# Patient Record
Sex: Female | Born: 1960 | Race: Black or African American | Hispanic: No | Marital: Married
Health system: Southern US, Community
[De-identification: ages and names within clinical notes are randomized; demographics above are authoritative.]

---

## 2002-02-21 ENCOUNTER — Other Ambulatory Visit: Admission: RE | Admit: 2002-02-21 | Discharge: 2002-02-21 | Payer: Self-pay | Admitting: Family Medicine

## 2005-07-09 ENCOUNTER — Other Ambulatory Visit: Admission: RE | Admit: 2005-07-09 | Discharge: 2005-07-09 | Payer: Self-pay | Admitting: Family Medicine

## 2006-03-16 ENCOUNTER — Ambulatory Visit (HOSPITAL_COMMUNITY): Admission: RE | Admit: 2006-03-16 | Discharge: 2006-03-16 | Payer: Self-pay | Admitting: Family Medicine

## 2007-09-03 ENCOUNTER — Ambulatory Visit (HOSPITAL_COMMUNITY): Admission: RE | Admit: 2007-09-03 | Discharge: 2007-09-03 | Payer: Self-pay | Admitting: Emergency Medicine

## 2009-05-07 IMAGING — CT CT ABDOMEN W/ CM
2 of 5 series · 16 of 46 positions shown, 18 images · IV contrast (omnipaque)
Comparison: Ultrasound, 09/03/07.

CLINICAL DATA: Cramping and upper abdominal pain.  
ABDOMEN CT WITH CONTRAST ? 09/03/07:
TECHNIQUE: Multidetector CT imaging of the abdomen was performed following the standard protocol during bolus administration of intravenous contrast. 
Contrast: 125 ml Omnipaque 350 IV and oral contrast.
TECHNIQUE: Multidetector CT imaging of the pelvis was performed following the standard protocol during bolus administration of intravenous contrast.

[Series 2: abd_pel 5.0 b40f st · axial · 0.63mm/px · z∈[-372,-2]mm · 13 of 84 slices shown, 15 images]
[im 5/84  soft-tissue]
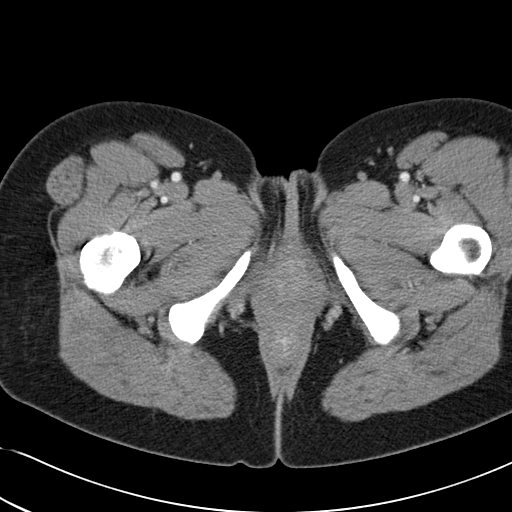
[im 5/84  bone]
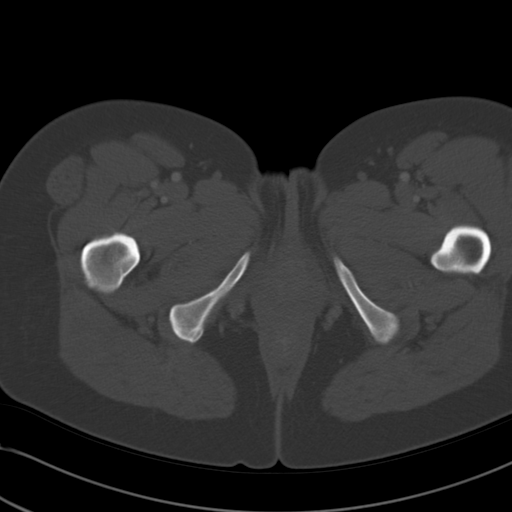
[im 14/84  soft-tissue]
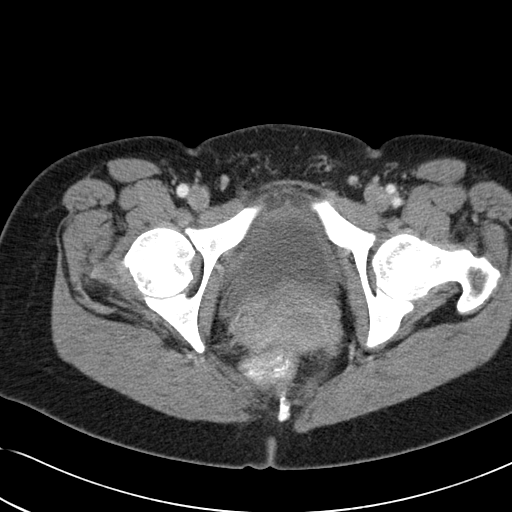
[im 18/84  soft-tissue]
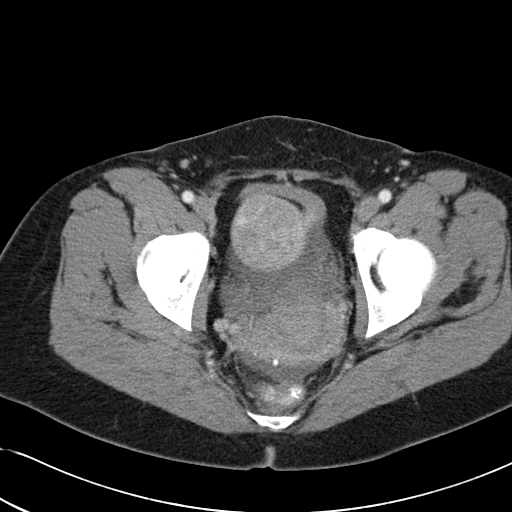
[im 22/84  soft-tissue]
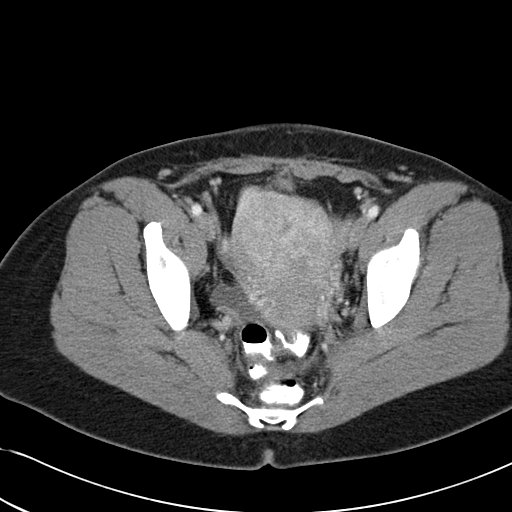
[im 31/84  soft-tissue]
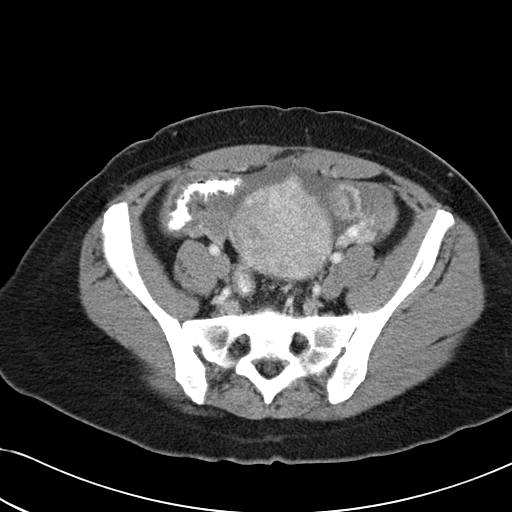
[im 35/84  soft-tissue]
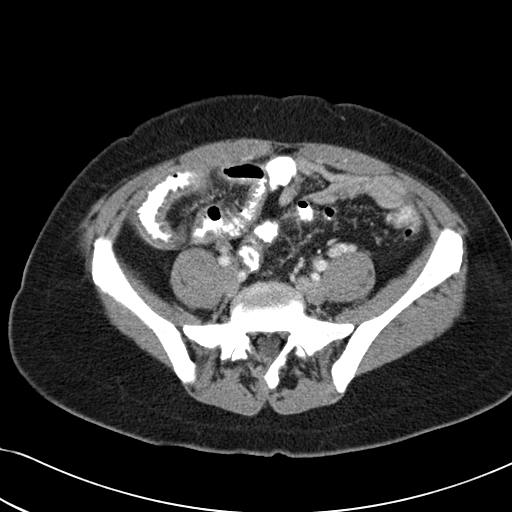
[im 44/84  soft-tissue]
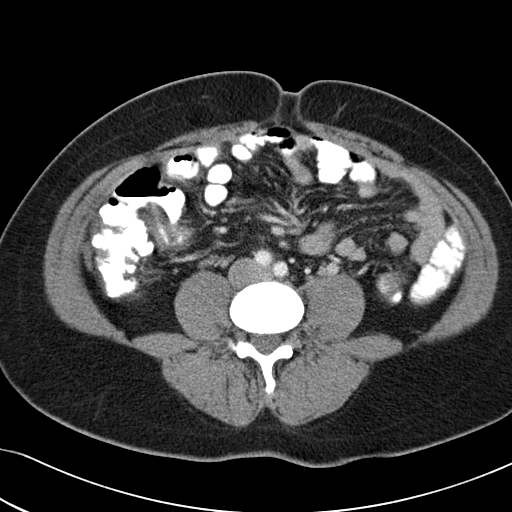
[im 49/84  soft-tissue]
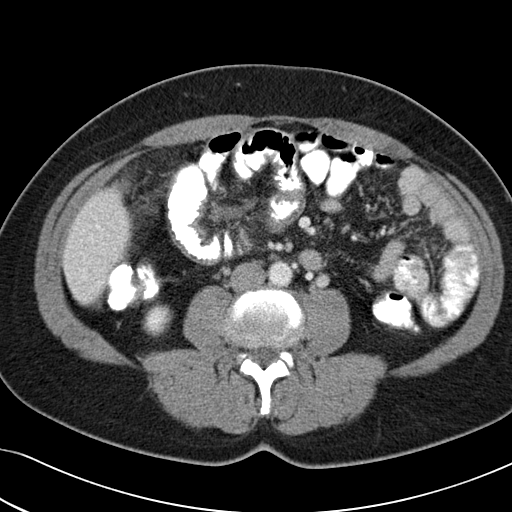
[im 53/84  soft-tissue]
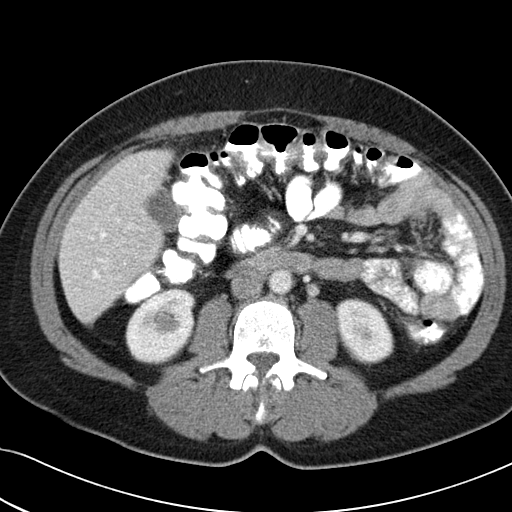
[im 53/84  bone]
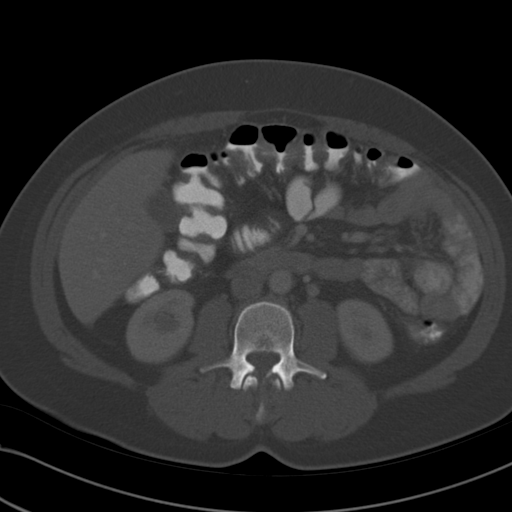
[im 62/84  soft-tissue]
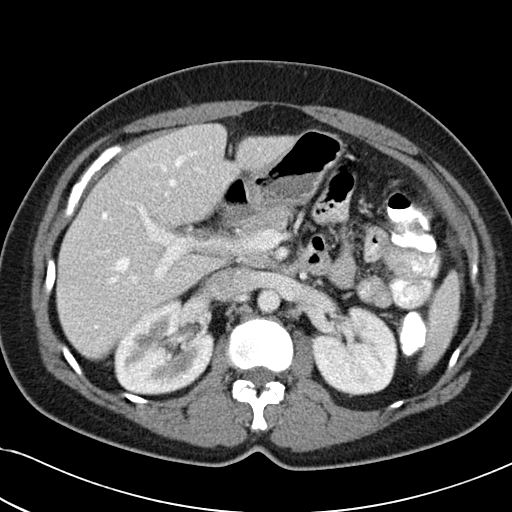
[im 66/84  soft-tissue]
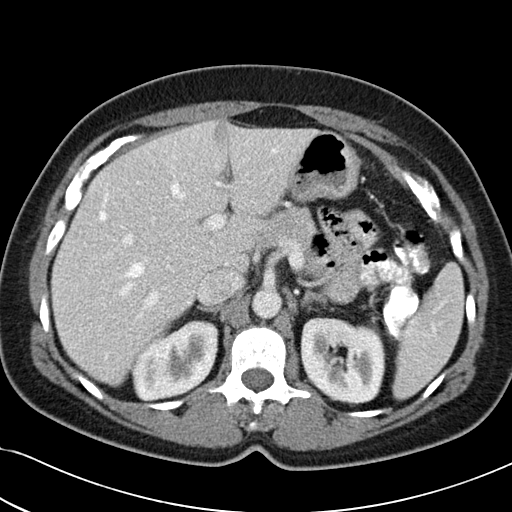
[im 70/84  soft-tissue]
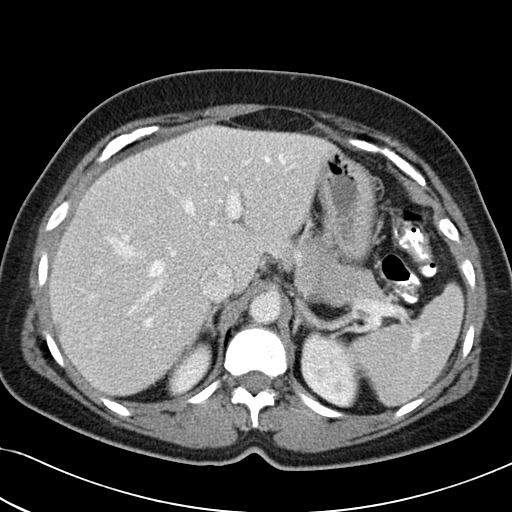
[im 79/84  soft-tissue]
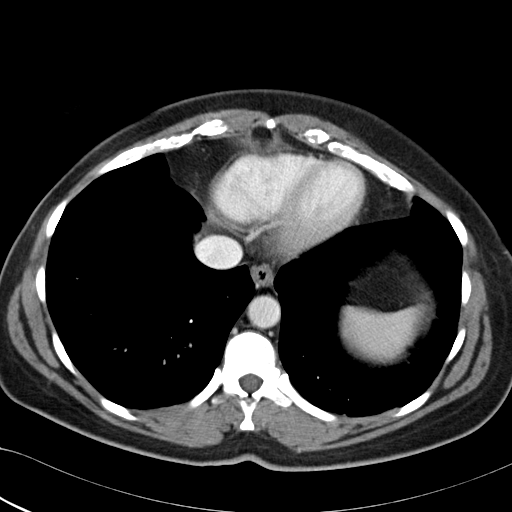

[Series 602: coronal · coronal · 0.85mm/px · 3 of 66 slices shown]
[im 22/66  soft-tissue]
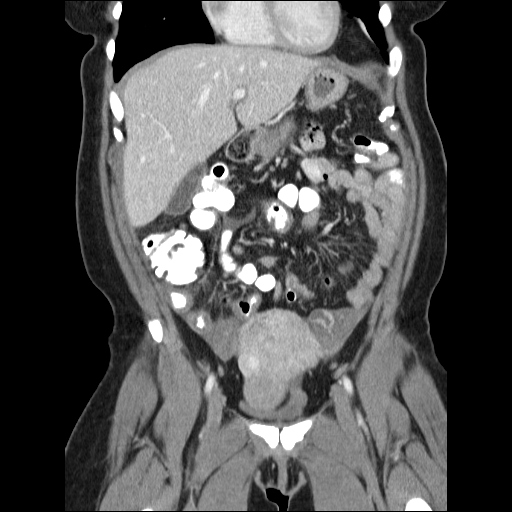
[im 29/66  soft-tissue]
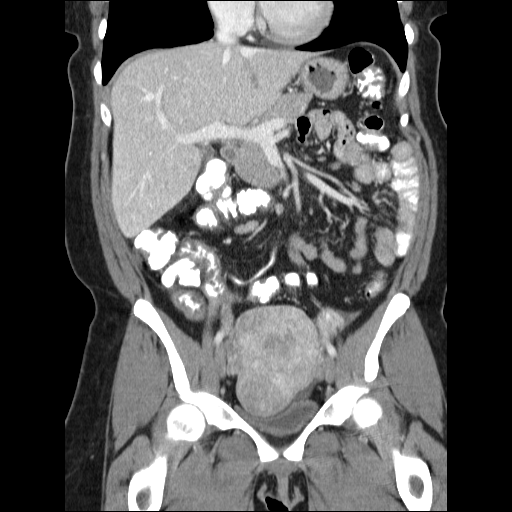
[im 37/66  soft-tissue]
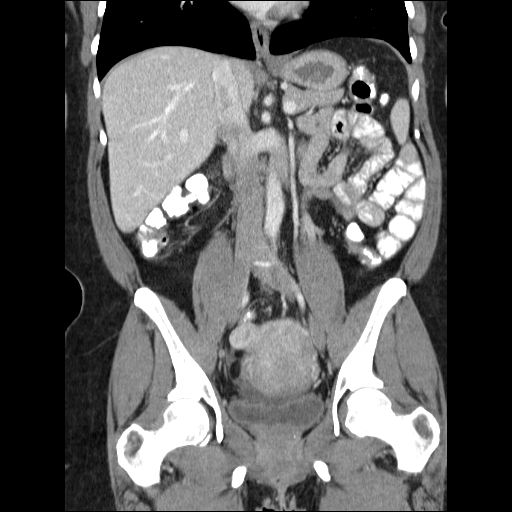

[16 of 46 positions shown; findings below may reference images not displayed]

FINDINGS: Dependent atelectasis.  Small cyst in the anterior right lower lobe.  
Mild fatty infiltration adjacent to the falciform ligament in the liver.  Normal renal enhancement and excretion of contrast.  The adrenal glands are normal bilaterally.  The spleen is unremarkable.  The stomach and duodenum are within normal limits.  The gallbladder appears normal or CT.  There is mild thickening of small bowel, likely representing jejunum.  This is present in the upper abdomen around image #35.  No evidence of obstruction.  There is small bowel inflammation of the ileum extending to the ileocecal valve.  Mild inflammatory stranding in the mesentery.  No colonic inflammatory process is identified.  A 1.5 cm mesenteric lymph node is identified on image #38 just anterior to the IVC.  A small amount of fluid is present in the leaves of the small bowel mesentery.  There is a bulbous appearance of the uncinate process of the pancreas however I think this probably represents a normal variation and no focal mass is identified.  Attention on followup exam is recommended.
IMPRESSION: 1.  Diffusely thickened ileum extending to the ileocecal valve, with small bowel mesenteric lymphadenopathy and fluid.  Although the appearance is nonspecific, it is suggestive of inflammatory bowel disease, specifically Crohn's disease.  Infectious and a variety of other causes can produce a similar appearance.  Ischemia is felt unlikely.  
2.  Bulbous appearance uncinate process of pancreas.  Although no mass is identified a followup CT in 1 to 2 months is recommended to reassess.  
PELVIS CT WITH CONTRAST ? 09/03/07:
FINDINGS: Fibroid uterus.  There is a moderate amount of free fluid in the anatomic pelvis, likely secondary to an abdominal inflammatory process.  The colon appears within normal limits and is decompressed.  The appendix is identified and normal.  The urinary bladder is unremarkable.  Sclerosis of both sacroiliac joints is present, and although the appearance can be seen in association with arthropathic spondylarthritis, the appearance is most suggestive of osteitis condensans ilii.
IMPRESSION: 1.  Free fluid in the anatomic pelvis secondary to small bowel inflammation.
2.  Fibroid uterus.
3.  Sclerosis of the iliac bone most consistent with osteitis condensans ilii, benign etiology.

## 2010-07-05 ENCOUNTER — Encounter: Payer: Self-pay | Admitting: Family Medicine

## 2011-06-30 ENCOUNTER — Other Ambulatory Visit: Payer: Self-pay | Admitting: Physician Assistant

## 2011-06-30 ENCOUNTER — Other Ambulatory Visit (HOSPITAL_COMMUNITY)
Admission: RE | Admit: 2011-06-30 | Discharge: 2011-06-30 | Disposition: A | Discharge status: Home / Self Care | Payer: 59 | Source: Ambulatory Visit | Attending: Family Medicine | Admitting: Family Medicine

## 2011-06-30 DIAGNOSIS — Z0000 Encounter for general adult medical examination without abnormal findings: Secondary | ICD-10-CM | POA: Insufficient documentation

## 2014-05-29 ENCOUNTER — Other Ambulatory Visit (HOSPITAL_COMMUNITY)
Admission: RE | Admit: 2014-05-29 | Discharge: 2014-05-29 | Disposition: A | Discharge status: Home / Self Care | Payer: 59 | Source: Ambulatory Visit | Attending: Family Medicine | Admitting: Family Medicine

## 2014-05-29 ENCOUNTER — Other Ambulatory Visit: Payer: Self-pay | Admitting: Physician Assistant

## 2014-05-29 DIAGNOSIS — Z01411 Encounter for gynecological examination (general) (routine) with abnormal findings: Secondary | ICD-10-CM | POA: Diagnosis present

## 2014-05-29 DIAGNOSIS — Z1151 Encounter for screening for human papillomavirus (HPV): Secondary | ICD-10-CM | POA: Diagnosis present

## 2014-05-30 ENCOUNTER — Other Ambulatory Visit (HOSPITAL_COMMUNITY): Payer: Self-pay | Admitting: Family Medicine

## 2014-05-30 ENCOUNTER — Other Ambulatory Visit: Payer: Self-pay | Admitting: Family Medicine

## 2014-05-30 DIAGNOSIS — IMO0002 Reserved for concepts with insufficient information to code with codable children: Secondary | ICD-10-CM

## 2014-05-30 DIAGNOSIS — Z1231 Encounter for screening mammogram for malignant neoplasm of breast: Secondary | ICD-10-CM

## 2014-05-30 DIAGNOSIS — R229 Localized swelling, mass and lump, unspecified: Principal | ICD-10-CM

## 2014-05-30 LAB — CYTOLOGY - PAP

## 2014-06-10 ENCOUNTER — Other Ambulatory Visit: Payer: 59

## 2014-06-11 ENCOUNTER — Ambulatory Visit
Admission: RE | Admit: 2014-06-11 | Discharge: 2014-06-11 | Disposition: A | Discharge status: Home / Self Care | Payer: 59 | Source: Ambulatory Visit | Attending: Family Medicine | Admitting: Family Medicine

## 2014-06-11 ENCOUNTER — Encounter (INDEPENDENT_AMBULATORY_CARE_PROVIDER_SITE_OTHER): Payer: Self-pay

## 2014-06-11 DIAGNOSIS — IMO0002 Reserved for concepts with insufficient information to code with codable children: Secondary | ICD-10-CM

## 2014-06-11 DIAGNOSIS — R229 Localized swelling, mass and lump, unspecified: Principal | ICD-10-CM

## 2014-06-17 ENCOUNTER — Ambulatory Visit (HOSPITAL_COMMUNITY): Payer: 59

## 2014-07-08 ENCOUNTER — Ambulatory Visit (HOSPITAL_COMMUNITY): Payer: 59 | Attending: Family Medicine

## 2016-02-13 IMAGING — US US ABDOMEN COMPLETE
1 series · 14 of 25 positions shown · non-contrast
Comparison: CT 09/03/2007.  Ultrasound 09/03/2007.

CLINICAL DATA: Periumbilical mass.

EXAM:
ULTRASOUND ABDOMEN COMPLETE

[Series 1: us abdomen complete · 0.30mm/px · 14 of 81 slices shown]
[im 1/81]
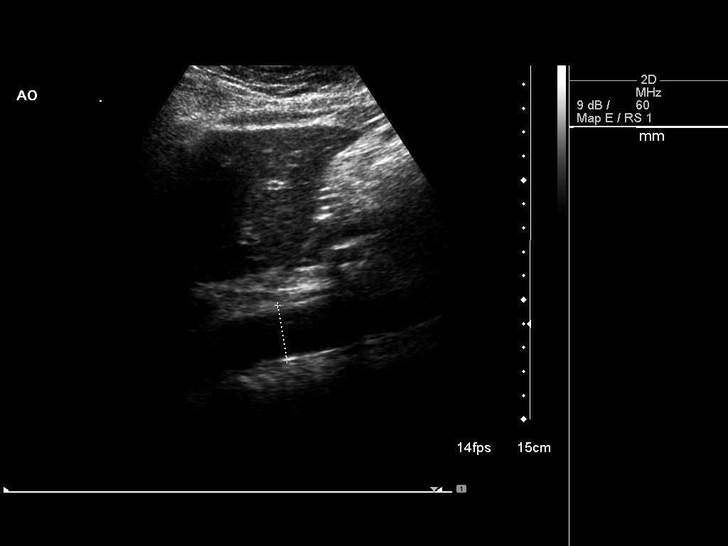
[im 7/81]
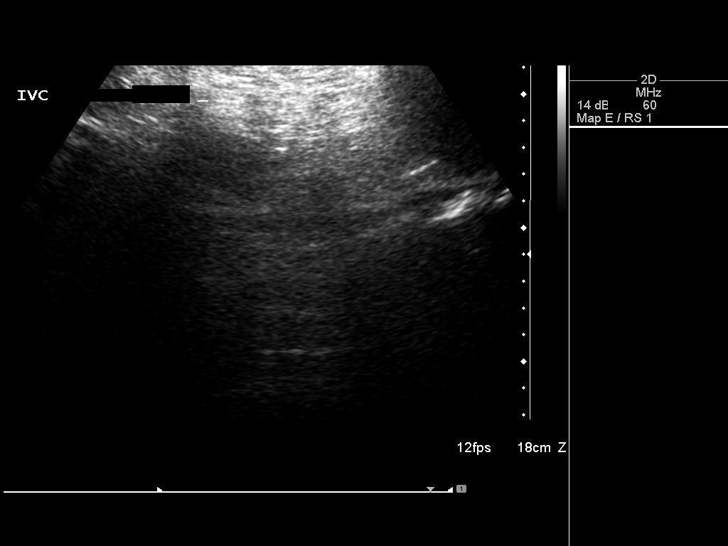
[im 14/81]
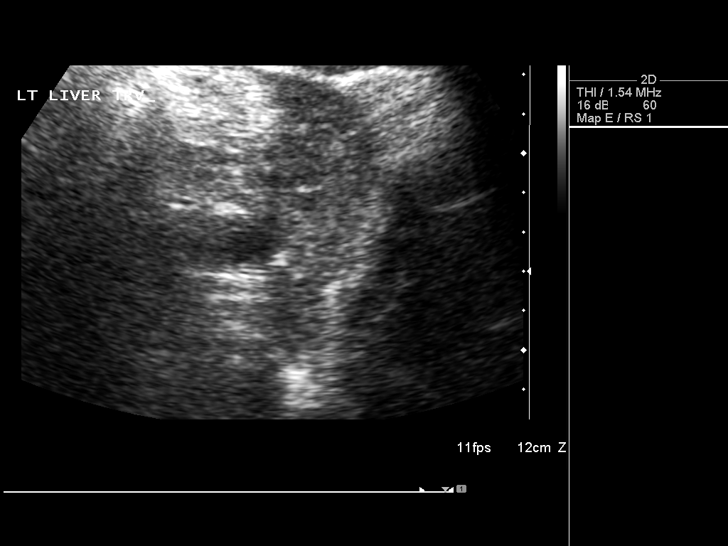
[im 21/81]
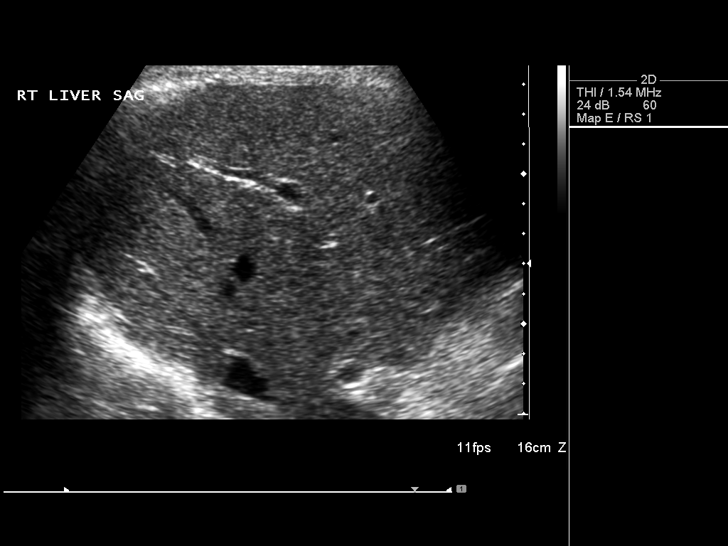
[im 27/81]
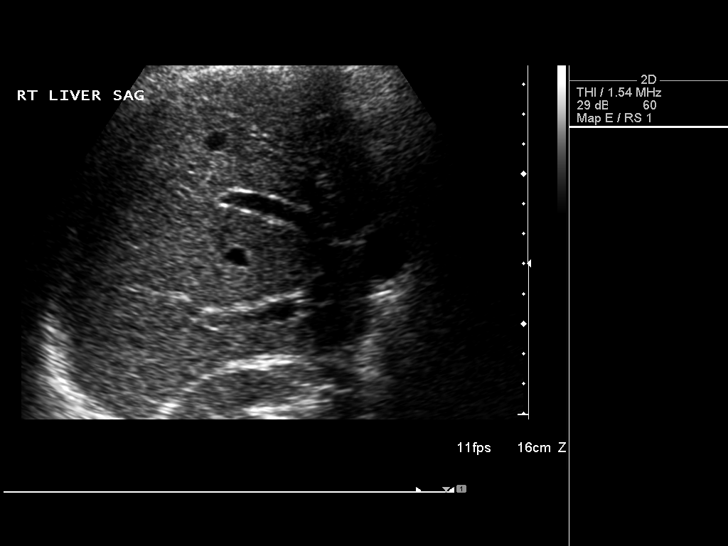
[im 31/81]
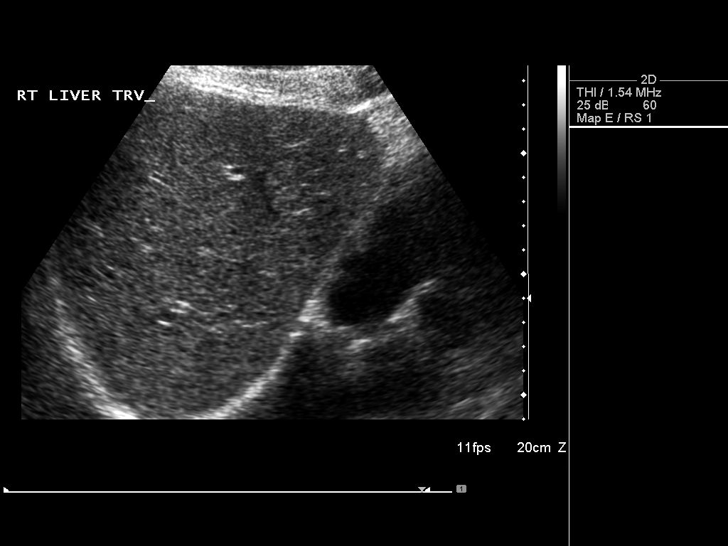
[im 37/81]
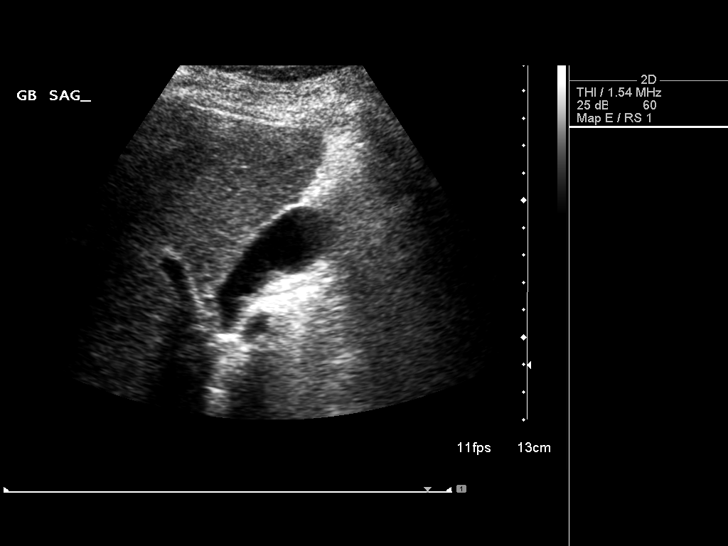
[im 44/81]
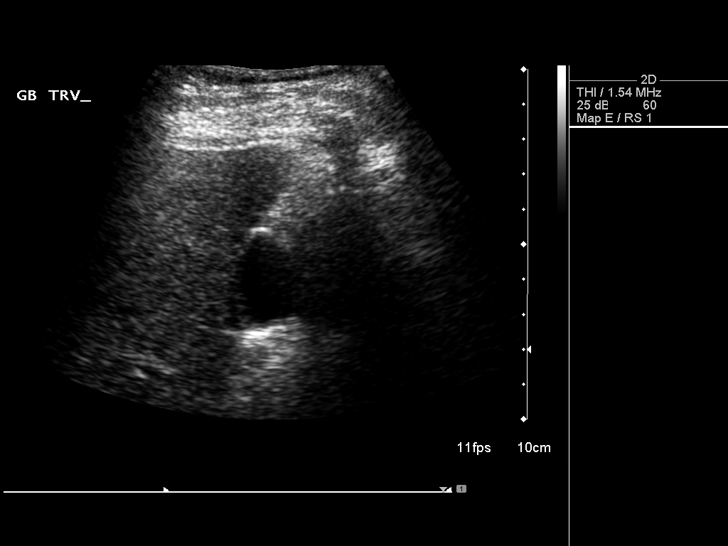
[im 51/81]
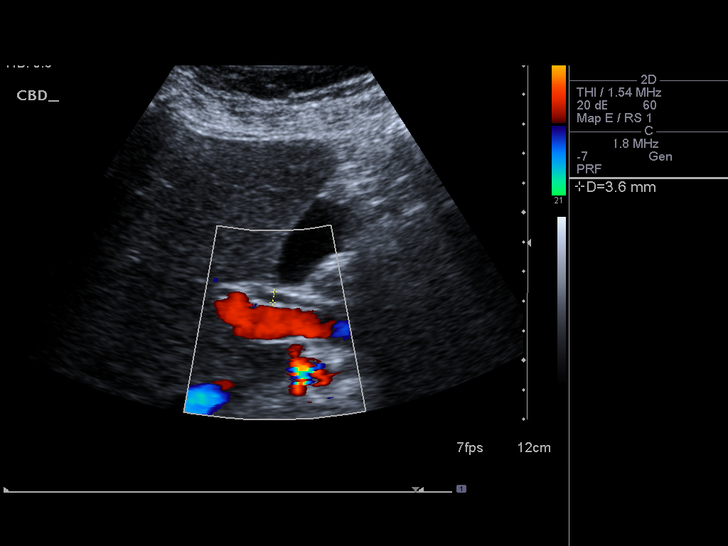
[im 54/81]
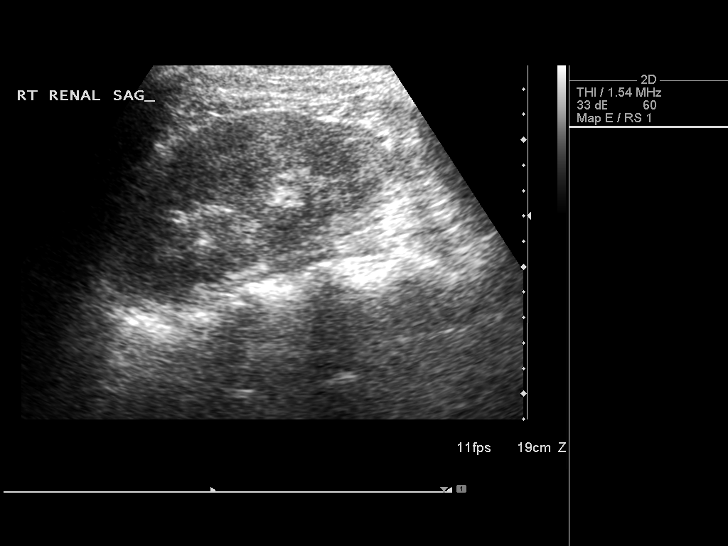
[im 61/81]
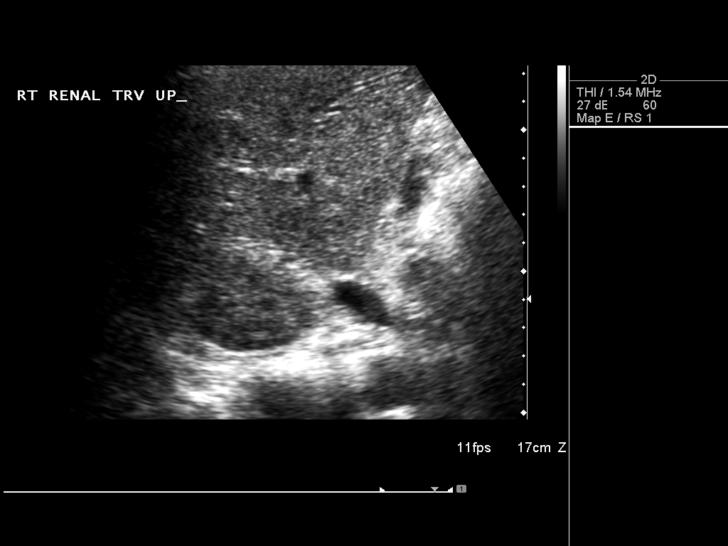
[im 67/81]
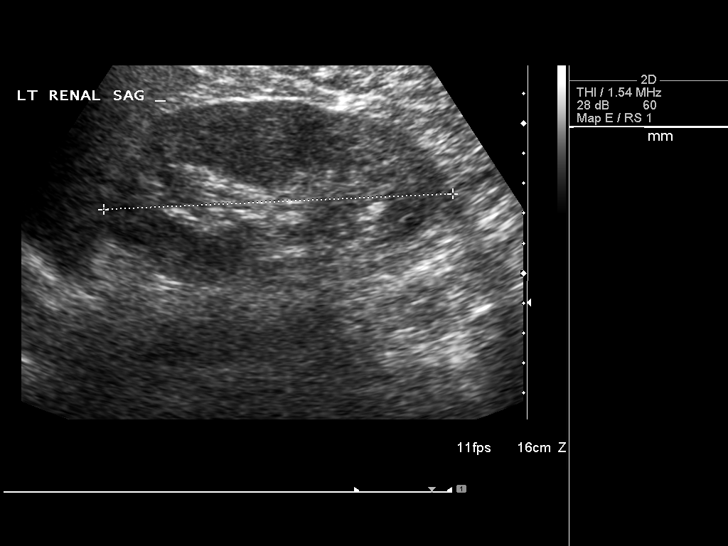
[im 74/81]
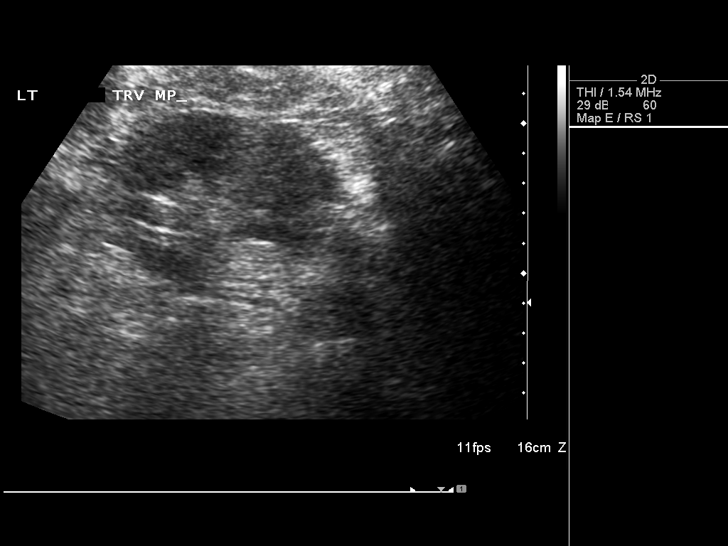
[im 81/81]
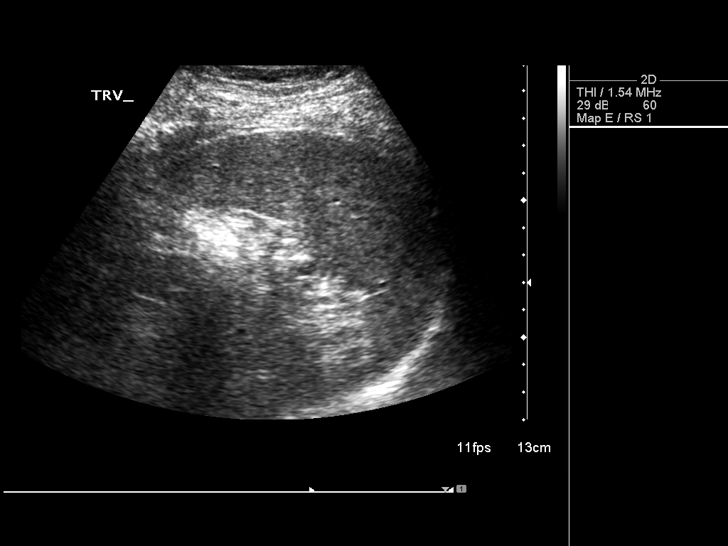

[14 of 25 positions shown; findings below may reference images not displayed]

FINDINGS: Gallbladder: No gallstones or wall thickening visualized. No
sonographic Murphy sign noted.

Common bile duct: Diameter: 3.6 mm

Liver: No focal lesion identified. Within normal limits in
parenchymal echogenicity.

IVC: No abnormality visualized.

Pancreas: Not visualized secondary to bowel gas.

Spleen: Size and appearance within normal limits.

Right Kidney: Length: 12.4 cm. Echogenicity within normal limits. No
mass or hydronephrosis visualized.

Left Kidney: Length: 11.7 cm. Echogenicity within normal limits. No
mass or hydronephrosis visualized.

Abdominal aorta: No aneurysm visualized.

Other findings: Incidental uterine enlargement noted.
IMPRESSION: 1. No acute abnormality.
2. Incidental uterine enlargement noted.

## 2016-11-04 ENCOUNTER — Ambulatory Visit: Payer: Self-pay | Admitting: Family Medicine

## 2016-11-04 DIAGNOSIS — G44209 Tension-type headache, unspecified, not intractable: Secondary | ICD-10-CM | POA: Diagnosis not present

## 2017-07-04 DIAGNOSIS — R002 Palpitations: Secondary | ICD-10-CM | POA: Diagnosis not present

## 2017-07-04 DIAGNOSIS — R8290 Unspecified abnormal findings in urine: Secondary | ICD-10-CM | POA: Diagnosis not present

## 2017-07-04 DIAGNOSIS — N951 Menopausal and female climacteric states: Secondary | ICD-10-CM | POA: Diagnosis not present

## 2017-12-29 DIAGNOSIS — R202 Paresthesia of skin: Secondary | ICD-10-CM | POA: Diagnosis not present

## 2018-04-04 DIAGNOSIS — E559 Vitamin D deficiency, unspecified: Secondary | ICD-10-CM | POA: Diagnosis not present

## 2019-04-12 ENCOUNTER — Other Ambulatory Visit (HOSPITAL_COMMUNITY)
Admission: RE | Admit: 2019-04-12 | Discharge: 2019-04-12 | Disposition: A | Discharge status: Home / Self Care | Payer: 59 | Source: Ambulatory Visit | Attending: Physician Assistant | Admitting: Physician Assistant

## 2019-04-12 ENCOUNTER — Other Ambulatory Visit: Payer: Self-pay | Admitting: Physician Assistant

## 2019-04-12 DIAGNOSIS — Z0000 Encounter for general adult medical examination without abnormal findings: Secondary | ICD-10-CM | POA: Diagnosis not present

## 2019-04-18 LAB — CYTOLOGY - PAP
Comment: NEGATIVE
Diagnosis: NEGATIVE
High risk HPV: NEGATIVE

## 2019-08-18 ENCOUNTER — Ambulatory Visit: Payer: 59

## 2019-08-23 ENCOUNTER — Ambulatory Visit: Payer: 59 | Attending: Internal Medicine

## 2019-10-10 ENCOUNTER — Other Ambulatory Visit: Payer: Self-pay | Admitting: Family Medicine

## 2019-10-10 DIAGNOSIS — Z1231 Encounter for screening mammogram for malignant neoplasm of breast: Secondary | ICD-10-CM

## 2020-05-31 ENCOUNTER — Ambulatory Visit: Payer: 59

## 2020-06-21 ENCOUNTER — Ambulatory Visit: Payer: 59 | Attending: Internal Medicine

## 2020-06-21 DIAGNOSIS — Z23 Encounter for immunization: Secondary | ICD-10-CM

## 2020-06-21 NOTE — Progress Notes (Signed)
   [facility]  Name:  Aundraya Dripps    MRN: [id number] DOB: 11-17-1960  06/21/2020  Ms. Charpentier was observed post Covid-19 immunization for 15 minutes without incident. She was provided with Vaccine Information Sheet and instruction to access the V-Safe system.   Ms. Loper was instructed to call 911 with any severe reactions post vaccine: Marland Kitchen Difficulty breathing  . Swelling of face and throat  . A fast heartbeat  . A bad rash all over body  . Dizziness and weakness   Immunizations Administered    Name Date Dose VIS Date Route   Pfizer COVID-19 Vaccine 06/21/2020 10:55 AM 0.3 mL 04/02/2020 Intramuscular   Manufacturer: [employer], [employer]   Lot: [id number]   NDC: [address]-7

## 2022-10-05 ENCOUNTER — Other Ambulatory Visit: Payer: Self-pay | Admitting: Family Medicine

## 2022-10-05 DIAGNOSIS — Z1231 Encounter for screening mammogram for malignant neoplasm of breast: Secondary | ICD-10-CM

## 2022-10-15 ENCOUNTER — Ambulatory Visit: Admission: RE | Admit: 2022-10-15 | Payer: 59 | Source: Ambulatory Visit

## 2022-10-28 ENCOUNTER — Ambulatory Visit: Admission: RE | Admit: 2022-10-28 | Payer: 59 | Source: Ambulatory Visit
